# Patient Record
Sex: Male | Born: 1980 | Race: Black or African American | Marital: Married | Smoking: Never smoker
Health system: Southern US, Community
[De-identification: ages and names within clinical notes are randomized; demographics above are authoritative.]

---

## 2015-03-18 ENCOUNTER — Encounter: Payer: Self-pay | Admitting: Family Medicine

## 2015-03-18 ENCOUNTER — Ambulatory Visit (INDEPENDENT_AMBULATORY_CARE_PROVIDER_SITE_OTHER): Payer: BLUE CROSS/BLUE SHIELD | Admitting: Family Medicine

## 2015-03-18 VITALS — BP 137/84 | HR 68 | Ht 73.0 in | Wt 210.0 lb

## 2015-03-18 DIAGNOSIS — S4992XA Unspecified injury of left shoulder and upper arm, initial encounter: Secondary | ICD-10-CM

## 2015-03-18 DIAGNOSIS — S4991XA Unspecified injury of right shoulder and upper arm, initial encounter: Secondary | ICD-10-CM

## 2015-03-18 NOTE — Patient Instructions (Signed)
You partially tore your pectoralis major tendon. We will go ahead with an MRI to assess the extent to which you tore this - it does not appear completely torn based on your exam and if so next step would be to do physical therapy to ease you back into full activities in the gym. Icing, ibuprofen only if needed for pain. I will call you the business day following the MRI to go over results and next steps.

## 2015-03-22 ENCOUNTER — Other Ambulatory Visit: Payer: Self-pay

## 2015-03-22 DIAGNOSIS — S4991XA Unspecified injury of right shoulder and upper arm, initial encounter: Secondary | ICD-10-CM | POA: Insufficient documentation

## 2015-03-22 NOTE — Progress Notes (Signed)
PCP: No primary care provider on file.  Subjective:   HPI: Patient is a 34 y.o. male here for right shoulder injury.  Patient reports on 6/17 he was doing bench press when he felt a pop anterior right shoulder. + swelling and redness. Pain has resolved since then 0/10 - was sharp, worse with movements of shoulder. No pain in bicep area - only anterior shoulder after this occurred. Did PT for one week initially but no other treatment, evaluation. No skin changes, fever, other complaints.  No past medical history on file.  No current outpatient prescriptions on file prior to visit.   No current facility-administered medications on file prior to visit.    No past surgical history on file.  No Known Allergies  Social History   Social History  . Marital Status: Married    Spouse Name: N/A  . Number of Children: N/A  . Years of Education: N/A   Occupational History  . Not on file.   Social History Main Topics  . Smoking status: Never Smoker   . Smokeless tobacco: Not on file  . Alcohol Use: Not on file  . Drug Use: Not on file  . Sexual Activity: Not on file   Other Topics Concern  . Not on file   Social History Narrative  . No narrative on file    No family history on file.  BP 137/84 mmHg  Pulse 68  Ht 6\' 1"  (1.854 m)  Wt 210 lb (95.255 kg)  BMI 27.71 kg/m2  Review of Systems: See HPI above.    Objective:  Physical Exam:  Gen: NAD, comfortable in exam room.  Right shoulder: No swelling, ecchymoses.  Defect noted anterior axillary fold, decreased bulk compared to left shoulder. No TTP. FROM. Negative Hawkins, Neers. Negative Speeds, Yergasons. Strength 5/5 with empty can and resisted internal/external rotation. Negative apprehension. NV intact distally.  Left shoulder: FROM without pain.    Assessment & Plan:  1. Right shoulder injury - consistent with pectoralis major tendon tear.  Will go ahead with MRI to assess extent, how much retraction  and fatty atrophy.  Discussed given how long it has been since injury it's unlikely surgical intervention will be an option.  NSAIDs, icing only if needed.

## 2015-03-22 NOTE — Assessment & Plan Note (Signed)
consistent with pectoralis major tendon tear.  Will go ahead with MRI to assess extent, how much retraction and fatty atrophy.  Discussed given how long it has been since injury it's unlikely surgical intervention will be an option.  NSAIDs, icing only if needed.

## 2015-04-20 ENCOUNTER — Telehealth: Payer: Self-pay | Admitting: Family Medicine

## 2015-04-20 NOTE — Telephone Encounter (Signed)
This is ok with me.  Hopefully the approval hasn't run out by now though.

## 2015-04-21 NOTE — Telephone Encounter (Signed)
Patient has been scheduled for MRI.

## 2015-04-27 ENCOUNTER — Other Ambulatory Visit: Payer: Self-pay | Admitting: Family Medicine

## 2015-04-27 ENCOUNTER — Ambulatory Visit (INDEPENDENT_AMBULATORY_CARE_PROVIDER_SITE_OTHER): Payer: BLUE CROSS/BLUE SHIELD

## 2015-04-27 DIAGNOSIS — S4992XA Unspecified injury of left shoulder and upper arm, initial encounter: Secondary | ICD-10-CM

## 2015-04-27 DIAGNOSIS — M7581 Other shoulder lesions, right shoulder: Secondary | ICD-10-CM

## 2015-04-28 NOTE — Addendum Note (Signed)
Addended by: Kathi SimpersWISE, Demario Faniel F on: 04/28/2015 09:36 AM   Modules accepted: Orders

## 2015-05-04 ENCOUNTER — Other Ambulatory Visit: Payer: Self-pay

## 2016-03-12 IMAGING — MR MR SHOULDER*R* W/O CM
5 series · 40 of 40 positions shown · non-contrast
Comparison: None.

CLINICAL DATA: Weight lifting injury in September 2014. Persistent right
shoulder pain.

EXAM:
MRI OF THE RIGHT SHOULDER WITHOUT CONTRAST
TECHNIQUE: Multiplanar, multisequence MR imaging of the shoulder was performed.
No intravenous contrast was administered. Assess for pectoralis
muscle/tendon tear.

[Series 4: T2 fat-sat · axial · 4.0mm · 0.59mm/px · z∈[-31,+75]mm · 8 of 25 slices shown (1 of 3)]
[im 1/25]
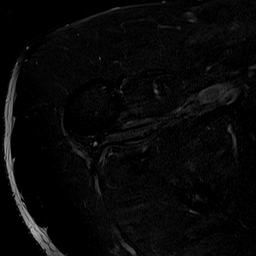
[im 4/25]
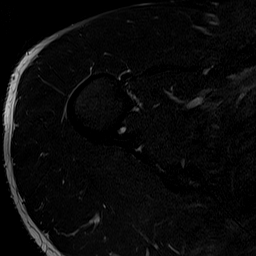
[im 7/25]
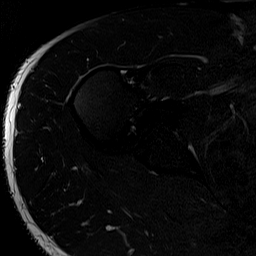
[im 11/25]
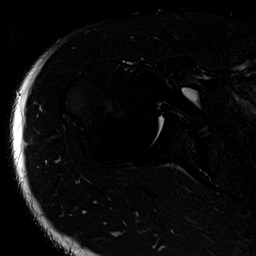
[im 14/25]
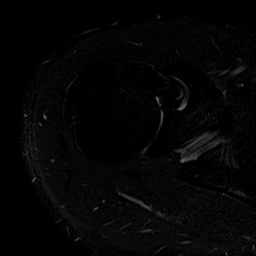
[im 18/25]
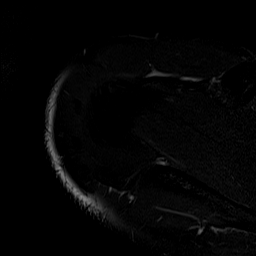
[im 21/25]
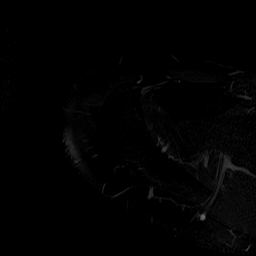
[im 25/25]
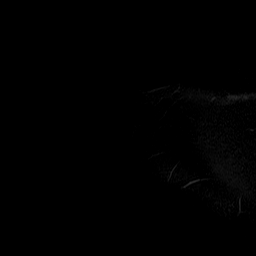

[Series 5: T2 fat-sat · oblique · 4.0mm · 0.59mm/px · 8 of 23 slices shown (2 of 3)]
[im 1/23]
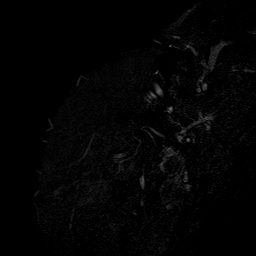
[im 4/23]
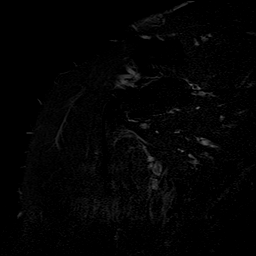
[im 7/23]
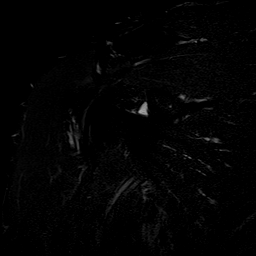
[im 10/23]
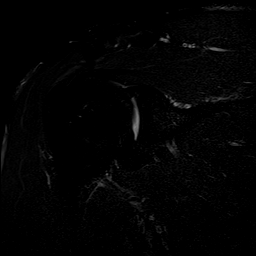
[im 13/23]
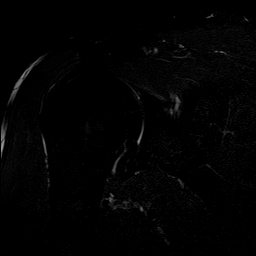
[im 16/23]
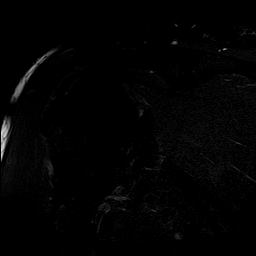
[im 19/23]
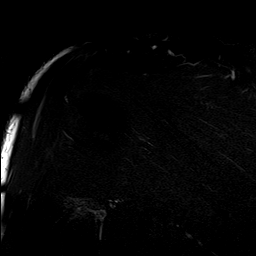
[im 23/23]
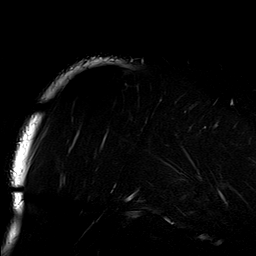

[Series 6: PD · oblique · 4.0mm · 0.59mm/px · 8 of 23 slices shown]
[im 1/23]
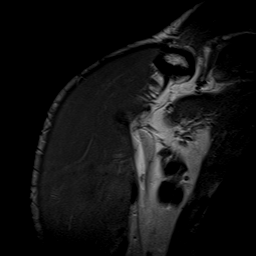
[im 4/23]
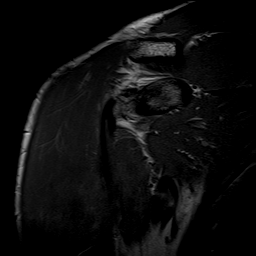
[im 7/23]
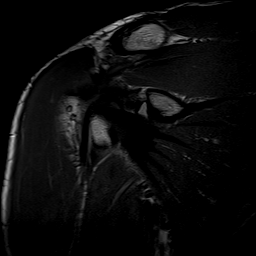
[im 10/23]
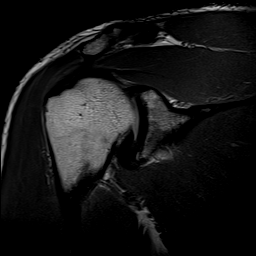
[im 13/23]
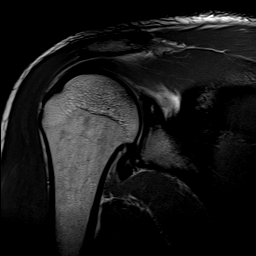
[im 16/23]
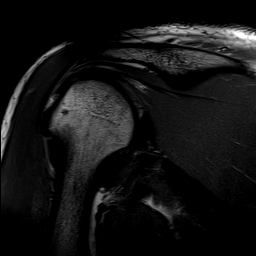
[im 19/23]
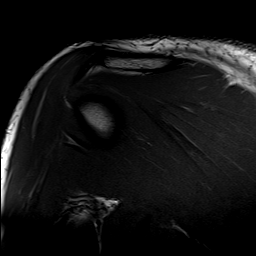
[im 23/23]
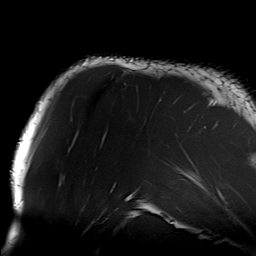

[Series 7: T1 · sagittal · 4.0mm · 0.59mm/px · 8 of 23 slices shown]
[im 1/23]
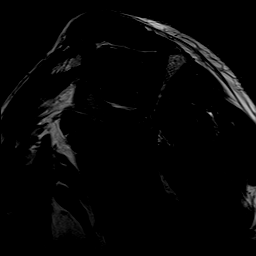
[im 4/23]
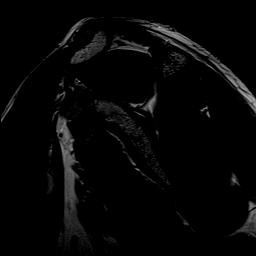
[im 7/23]
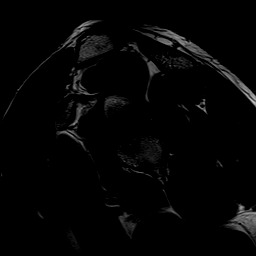
[im 10/23]
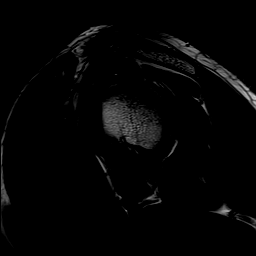
[im 13/23]
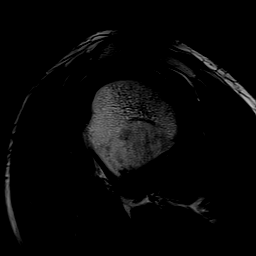
[im 16/23]
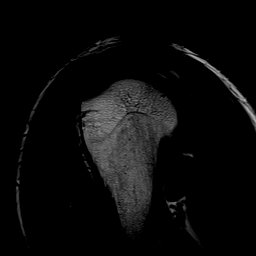
[im 19/23]
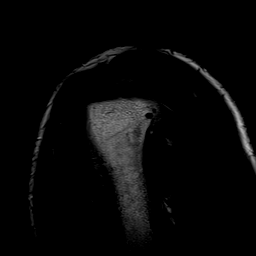
[im 23/23]
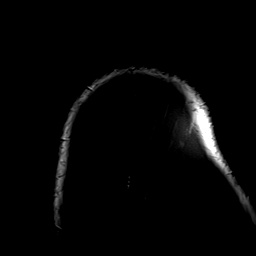

[Series 8: T2 fat-sat · sagittal · 4.0mm · 0.59mm/px · 8 of 23 slices shown (3 of 3)]
[im 1/23]
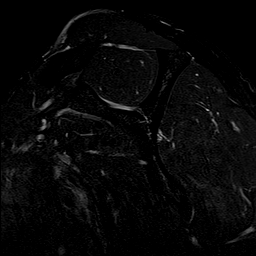
[im 4/23]
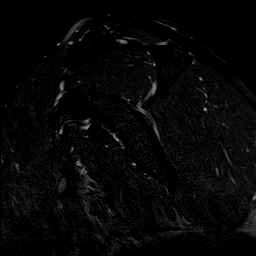
[im 7/23]
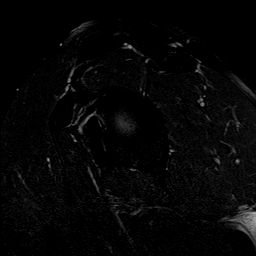
[im 10/23]
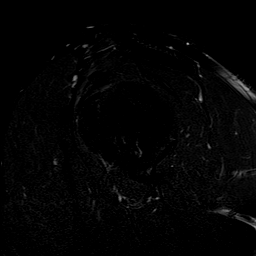
[im 13/23]
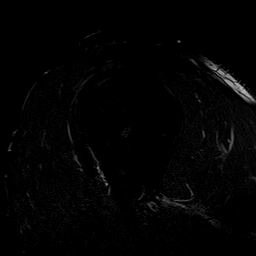
[im 16/23]
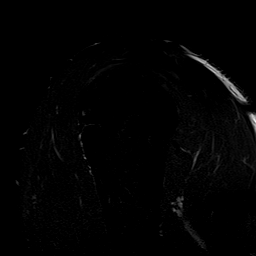
[im 19/23]
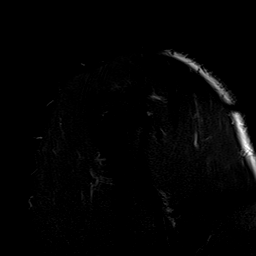
[im 23/23]
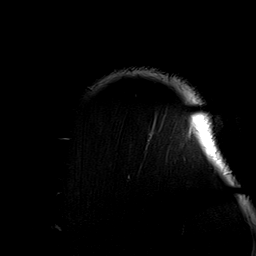

[40 of 40 positions shown; findings below may reference images not displayed]

FINDINGS: Rotator cuff: Mild rotator cuff tendinopathy/ tendinosis but no
partial or full thickness rotator cuff tear.

Muscles:  Normal.

Biceps long head:  Intact.

Acromioclavicular Joint: Widened AC joint could be due to prior
distal clavicle resection. Recommend clinical correlation. The
acromion is type 2 in shape. Mild lateral downsloping of the
acromion.

Glenohumeral Joint: Small joint effusion. There is thickening of the
capsular structures in the axillary recess which can be seen with
adhesive capsulitis or synovitis.

Labrum:  Grossly normal.

Bones:  No acute bony findings.

Other: No significant subacromial/ subdeltoid fluid collections to
suggest bursitis.
IMPRESSION: 1. Mild rotator cuff tendinopathy/tendinosis. No partial or full
thickness rotator cuff tear.
2. Mildly widened AC joint. Possible prior AC joint separation or
previous surgery with distal clavicle resection. Recommend clinical
correlation.
3. Intact long head biceps tendon and grossly normal glenoid labrum.
4. There is thickening of the capsular structures in the axillary
recess which can be seen with adhesive capsulitis or synovitis.
5. This examination does not adequately demonstrate the pectoralis
muscle or tendon. Patient will be recalled for that specific
protocol/ examination.
